# Patient Record
Sex: Male | Born: 1935 | Race: White | Hispanic: No | Marital: Married | Smoking: Never smoker
Health system: Southern US, Community
[De-identification: ages and names within clinical notes are randomized; demographics above are authoritative.]

## PROBLEM LIST (undated history)

## (undated) DIAGNOSIS — C61 Malignant neoplasm of prostate: Secondary | ICD-10-CM

## (undated) DIAGNOSIS — E119 Type 2 diabetes mellitus without complications: Secondary | ICD-10-CM

## (undated) HISTORY — PX: PROSTATECTOMY: SHX69

## (undated) HISTORY — DX: Malignant neoplasm of prostate: C61

## (undated) HISTORY — DX: Type 2 diabetes mellitus without complications: E11.9

---

## 2009-04-24 ENCOUNTER — Ambulatory Visit: Payer: Self-pay | Admitting: Sports Medicine

## 2009-04-24 DIAGNOSIS — M722 Plantar fascial fibromatosis: Secondary | ICD-10-CM

## 2009-05-14 ENCOUNTER — Ambulatory Visit: Payer: Self-pay | Admitting: Diagnostic Radiology

## 2009-05-14 ENCOUNTER — Emergency Department (HOSPITAL_BASED_OUTPATIENT_CLINIC_OR_DEPARTMENT_OTHER): Admission: EM | Admit: 2009-05-14 | Discharge: 2009-05-14 | Payer: Self-pay | Admitting: Emergency Medicine

## 2009-05-26 ENCOUNTER — Ambulatory Visit: Payer: Self-pay | Admitting: Sports Medicine

## 2009-05-26 DIAGNOSIS — S93409A Sprain of unspecified ligament of unspecified ankle, initial encounter: Secondary | ICD-10-CM | POA: Insufficient documentation

## 2009-06-25 ENCOUNTER — Ambulatory Visit: Payer: Self-pay | Admitting: Sports Medicine

## 2009-08-07 ENCOUNTER — Ambulatory Visit: Payer: Self-pay | Admitting: Sports Medicine

## 2010-07-28 NOTE — Assessment & Plan Note (Signed)
Summary: F/U ANKLE/ORTHOTICS,[facility]   Vital Signs:  Patient profile:   75 year old male BP sitting:   134 / 82  Vitals Entered By: Lillia Pauls CMA (August 07, 2009 10:43 AM)  History of Present Illness: 75 yo M here for f/u R ankle sprain and R PF  1. R ankle sprain Much improved Wore ASO for 2 months straight and continues to use when on golf course No longer having pain in ankle No swelling.  2. R PF Increasing issues past 2 weeks Saw orthopedic physician in [place] and had injection in R PF without much relief Has been doing eccentric exercises but pain still fairly debilitating Icing and his insoles have a little arch support already.  Physical Exam  General:  Well-developed,well-nourished,in no acute distress; alert,appropriate and cooperative throughout examination Msk:  R ankle: No gross deformity, swelling, bruising No TTP about entire ankle. FROM Strength 5/5 all motions. 1+ ant drawer, neg talar tilt.  Normal inspection with no visible or palpable fat pad atrophy and no visible swelling/erythema. Patient is tender at medial insertion of plantar fascia into calcaneus. Great toe motion: full Arch shape: mild pronation with normal arch shape Other foot breakdown: mild transverse arch breakdown   Impression & Recommendations:  Problem # 1:  PLANTAR FASCIITIS, RIGHT (ICD-728.71) Assessment Deteriorated  Compliant with conservative treatment (exercises, arch strap, icing, nsaids as needed, had cortisone injection) and no relief - will try orthotics.  Patient was fitted for a : standard, cushioned, semi-rigid orthotic. The orthotic was heated and afterward the patient stood on the orthotic blank positioned on the orthotic stand. The patient was positioned in subtalar neutral position and 10 degrees of ankle dorsiflexion in a weight bearing stance. After completion of molding, a stable base was applied to the orthotic blank. The blank was ground to a stable  position for weight bearing. Size: 11 striped fasttek Base: blue med density eva Posting: none Additional orthotic padding: none Total prep time 40 minutes.  Felt very comfortable in these - pain improved immediately with this support.  Orders: [employer], each unit [phone])  Problem # 2:  ANKLE SPRAIN, RIGHT (ICD-845.00) Assessment: Improved Continue with ASO on uneven surfaces, home exercises.

## 2010-10-07 ENCOUNTER — Encounter: Payer: Self-pay | Admitting: Sports Medicine

## 2010-10-07 ENCOUNTER — Ambulatory Visit (INDEPENDENT_AMBULATORY_CARE_PROVIDER_SITE_OTHER): Payer: Medicare Other | Admitting: Sports Medicine

## 2010-10-07 VITALS — BP 146/78 | HR 47 | Ht 69.0 in | Wt 165.0 lb

## 2010-10-07 DIAGNOSIS — M25519 Pain in unspecified shoulder: Secondary | ICD-10-CM

## 2010-10-07 DIAGNOSIS — M25511 Pain in right shoulder: Secondary | ICD-10-CM | POA: Insufficient documentation

## 2010-10-07 NOTE — Assessment & Plan Note (Signed)
The patient was given a series of exercises to strengthen his internal rotation and some other exercises to stabilize the entire rotator cuff. I do not see any signs of rotator cuff tear and suspect this is a mild tendinopathy involving the subscapularis tendon. With this in mind I will keep him on the rehabilitation exercises. He can use some when necessary Advil Aleve. He can use heat as needed. He may start some easy swelling of the golf club when this was not painful. If it is not responding in 3 weeks I would like him to return and I will scan the shoulder to see how much injury is present.

## 2010-10-07 NOTE — Progress Notes (Signed)
  Subjective:    Patient ID: Tom Kim, male    DOB: 07/26/1935, 75 y.o.   MRN: [id number]  HPI 3 weeks ago RT shoulder injury on bench press on nautilus.  RT arm went weak during actual lift. Noted some radiating pain to elbow but not below. Noted swelling at trap area that responded to ice.  He was seen in urgent care Center in [place] but they only suggested resting. He wants to rehabilitate this injury so he can return to golf. Currently is not having any night pain. He is now using some heat on the shoulder which has improved his motion. He still feels pain in contrast to reach behind his back.  Review of Systems     Objective:   Physical Exam Patient is in no acute distress  Shoulder: Inspection reveals no abnormalities,  atrophy or asymmetry. Palpation is normal with no tenderness over AC joint or bicipital groove. ROM is full in all planes with some limitation on back scratch Rotator cuff strength normal throughout except push off test and IR with the arm elevated and ER No signs of impingement with negative Neer and Hawkin's tests, empty can. Speeds and Yergason's tests normal. No labral pathology noted with negative Obrien's, negative clunk and good stability. Normal scapular function observed. No painful arc and no drop arm sign. No apprehension sign       Assessment & Plan:

## 2011-04-05 ENCOUNTER — Ambulatory Visit (INDEPENDENT_AMBULATORY_CARE_PROVIDER_SITE_OTHER): Payer: Medicare Other | Admitting: Sports Medicine

## 2011-04-05 VITALS — BP 140/80

## 2011-04-05 DIAGNOSIS — M25511 Pain in right shoulder: Secondary | ICD-10-CM

## 2011-04-05 DIAGNOSIS — M25519 Pain in unspecified shoulder: Secondary | ICD-10-CM

## 2011-04-05 MED ORDER — NITROGLYCERIN 0.2 MG/HR TD PT24: MEDICATED_PATCH | TRANSDERMAL | Status: AC

## 2011-04-05 NOTE — Progress Notes (Signed)
  Subjective:    Patient ID: Tom Kim, male    DOB: Dec 22, 1935, 75 y.o.   MRN: [id number]  HPI 75 y/o male is here for follow up for right shoulder pain.  At the last visit there was a concern for possible impingement syndrome.  He has been doing rotator cuff rehab and taking alleve.  His symptoms have not improved very much since the last visit.  He gets pain reaching overhead and at night.  The pain is lateral and radiates down the lateral part of the upper arm to the elbow.  Never below the elbow.   Review of Systems     Objective:   Physical Exam Shoulder:  Palpation is normal with no tenderness over AC joint or bicipital groove. ROM: FF to 170, abduction to 170, IR to T10, external rotation to 70 Rotator cuff strength normal slightly decreased from the unaffected side with the arm abducted and internally rotated Mildly positive empty can and Hawkins test Speeds and Yergason's tests normal. No labral pathology noted with negative Obrien's No painful arc and no drop arm sign. No apprehension sign but he does have pain with internal rotation against resistance  Ultrasound:  Biceps tendon has a small fluid pocket best visualized on the long view.  The subscapularis tendon has calcifications c/w an old injury or chronic inflammation.  There is no impingement of the subscapularis with dynamic testing.  The supraspinatus tendon appears normal but does show impingement on dynamic testing.      Assessment & Plan:

## 2011-04-05 NOTE — Assessment & Plan Note (Addendum)
Will continue with his rotator cuff rehab exercises but he will start doing them with a theraband.  Starting NTG today, 1/4 patch daily.  We will recheck this in 6 weeks  He can continue weight activities that do not create pain or stress his rotator cuff

## 2011-04-05 NOTE — Patient Instructions (Signed)
1. Start using your theraband with your exercises.  Avoid going above the head for 2 weeks.  2. Start your patches but let us know if you dizzy or headaches.  3. Follow in 6 weeks.

## 2011-05-17 ENCOUNTER — Ambulatory Visit (INDEPENDENT_AMBULATORY_CARE_PROVIDER_SITE_OTHER): Payer: Medicare Other | Admitting: Sports Medicine

## 2011-05-17 VITALS — BP 130/70

## 2011-05-17 DIAGNOSIS — M25511 Pain in right shoulder: Secondary | ICD-10-CM

## 2011-05-17 DIAGNOSIS — M25519 Pain in unspecified shoulder: Secondary | ICD-10-CM

## 2011-05-17 NOTE — Progress Notes (Signed)
  Subjective:    Patient ID: Alessandra Grout, male    DOB: June 03, 1936, 75 y.o.   MRN: [id number]  HPI  75 y/o male is here to follow up on right shoulder pain.  He is about the same as before.  He has discomfort with overhead lifting and with reaching across his chest.  He is doing his RTC exercises with the theraband without difficulty.   Review of Systems     Objective:   Physical Exam  Right shoulder: Inspection reveals no abnormalities, atrophy or asymmetry. Tenderness to palpation over the biceps tendon. ROM is full in FF and Abduction but decreased in internal rotation (to L5) Rotator cuff is somewhat decreased but not markedly  Equivocal empty can (pt indicates that it is a mild discomfort but not pain). Positive Obrien's  Negative speed's Equivocal painful arc (pt indicates that it is a mild discomfort but not pain) No drop arm sign.   Ultrasound: Fluid pocket visualized around the biceps tendon is smaller than on previous scan.  The subscapularis tendon has some calcification that would be c/w an old injury.  The supraspinatus and infraspinatus are normal.  There is no impingement on dynamic testing.      Assessment & Plan:

## 2011-05-17 NOTE — Patient Instructions (Signed)
1. Continue doing your theraband exercises but be careful not to push yourself.  You shouldn't experience pain with your exercise.  2. Start wearing 1/2 patch a day.  Wear the patch on the front part of the shoulder.  3. Follow up with [place] before you leave town.

## 2011-05-17 NOTE — Assessment & Plan Note (Signed)
His pain focus is now at the biceps tendon.  We will increase him to 1/2 patch daily, continue theraband exercises, and follow up in about a month.

## 2011-06-15 ENCOUNTER — Ambulatory Visit (INDEPENDENT_AMBULATORY_CARE_PROVIDER_SITE_OTHER): Payer: Medicare Other | Admitting: Sports Medicine

## 2011-06-15 VITALS — BP 110/70

## 2011-06-15 DIAGNOSIS — M25511 Pain in right shoulder: Secondary | ICD-10-CM

## 2011-06-15 DIAGNOSIS — M25519 Pain in unspecified shoulder: Secondary | ICD-10-CM

## 2011-06-15 NOTE — Patient Instructions (Signed)
It was great to see you today!  Great work on healing your shoulder tear.  Keep working on stretching and strengthening exercises.  Start with 3 lbs dumbbells and do stretching exercises as directed by Dr. Darrick Penna.  Use the nitro patch for another month and use it every other day after that to wean.

## 2011-06-15 NOTE — Assessment & Plan Note (Signed)
Tear healed. U/S better. Remaining scar tissue causing decreased range of motion and pain. Decrease nitro patch. Stretching exercises. F/u prn

## 2011-06-15 NOTE — Progress Notes (Signed)
  Subjective:    Patient ID: Tom Kim, male    DOB: [birth year], 75 y.o.   MRN: [id number]  HPI  75 y/o male is here to follow up on right shoulder pain. He has no change. Still has pain. Especially when he sleeps on right side. He is using a half of the nitro patch. He is not taking PO meds or topical nsaids.   While his pain is about the same he feels that he is stronger and is able to do all of the rehabilitation exercise pretty easily with theraband   Review of Systems No fevers, chills, weight loss.    Objective:   Physical Exam  Right shoulder: Inspection reveals no abnormalities, atrophy or asymmetry. No tenderness to palpation over the biceps tendon. No tenderness at any point on the shoulder. ROM is full in FF and Abduction, slower movement than left arm 2nd guarding. Negative empty can Negative speed's Pain and weakness with external rotation. Internal rotation normal. Hawkins negative.  Ultrasound RT shoulder Bicipital tendon looks improved from last visit and is normal today There is some distal calcifications in both base of the scapula there is an infraspinatus tendons The supraspinatous tendon is normal No rotator cuff tears are visualized Humeral head is slightly irregular AC. joint shows some arthritic change     Assessment & Plan:

## 2011-06-17 ENCOUNTER — Other Ambulatory Visit: Payer: Self-pay | Admitting: *Deleted

## 2011-06-17 DIAGNOSIS — M25511 Pain in right shoulder: Secondary | ICD-10-CM

## 2011-11-10 ENCOUNTER — Encounter: Payer: Self-pay | Admitting: Sports Medicine

## 2011-11-10 ENCOUNTER — Ambulatory Visit (INDEPENDENT_AMBULATORY_CARE_PROVIDER_SITE_OTHER): Payer: Medicare Other | Admitting: Sports Medicine

## 2011-11-10 VITALS — BP 116/72 | HR 59

## 2011-11-10 DIAGNOSIS — M25511 Pain in right shoulder: Secondary | ICD-10-CM

## 2011-11-10 DIAGNOSIS — M25519 Pain in unspecified shoulder: Secondary | ICD-10-CM

## 2011-11-10 NOTE — Progress Notes (Signed)
  Subjective:    Patient ID: Alessandra Grout, male    DOB: 07/08/1935, 76 y.o.   MRN: [id number]  HPI  Pt presents to clinic for f/u of rt shoulder pain which he says is 75% improved. Did PT in [place] over the winter which was helpful. ROM is improving.  Now doing home exercises and continues to use 1/2 NTG patch on shoulder daily. Has some discomfort when he lays on rt shoulder when in bed. Recently started yoga, has some pain with certain poses such as downward dog.  Aleve is helpful for pain.  No night pain   Review of Systems     Objective:   Physical Exam  NAD  Good biceps function bilat, biceps groove non tender Speed's and yergason's negative bilat ER and IR at waist normal Empty can negative on rt Hawkins negative on rt Cross over test normal on rt Back scratch equal bilat Good strength with ER at 90 deg abduction        Assessment & Plan:

## 2011-11-10 NOTE — Patient Instructions (Signed)
You are 90% improved according to your exam.  It is ok for you to discontinue nitroglycerin patch  Continue doing range of motion exercises daily  It is ok for you to resume preferred upper body strengthening activities  Please follow up as needed  Thank you for seeing [place] today!

## 2011-11-11 NOTE — Assessment & Plan Note (Signed)
He has minimal RC signs on exam Last [place] had essentailly normalized  I think he is able to do 90% of what he wants  Keep HEP   Use prn meds  Reck with me prn  OK to stop NTG

## 2012-11-01 ENCOUNTER — Ambulatory Visit (INDEPENDENT_AMBULATORY_CARE_PROVIDER_SITE_OTHER): Payer: Medicare Other | Admitting: Sports Medicine

## 2012-11-01 ENCOUNTER — Encounter: Payer: Self-pay | Admitting: Sports Medicine

## 2012-11-01 VITALS — BP 110/70 | Ht 70.0 in | Wt 164.0 lb

## 2012-11-01 DIAGNOSIS — M25511 Pain in right shoulder: Secondary | ICD-10-CM

## 2012-11-01 DIAGNOSIS — M25519 Pain in unspecified shoulder: Secondary | ICD-10-CM

## 2012-11-01 NOTE — Progress Notes (Signed)
Chief complaint: Right shoulder pain  History of present illness: The patient has had some right shoulder pain for quite some time and was seen one year ago for similar complaint. The patient did do physical therapy in [place] again over the course of time and is continued home exercises improving his range of motion. 18 months ago patient did have a ultrasound done that showed improvement of a subscapularis tendon calcification with potential tear as well as biceps tendinitis.  Patient is able to do all activities of daily living it states that when he tries to do pushups or if he tries to do more than 18 holes of golf a day he starts having pain in his right shoulder. Patient also states from time to time it does wake him up when he lays on top of the shoulder. Patient denies any radiation down the arm, denies any numbness or tingling. Patient has taken Aleve for it previously and it does help but he does not like to take medications when possible.  Past medical history, social, surgical and family history all reviewed.   Physical exam Blood pressure 110/70, height 5\' 10"  (1.778 m), weight 164 lb (74.39 kg). General: No apparent distress alert and oriented x3 mood and affect normal Respiratory: Patient's speak in full sentences and does not appear short of breath Skin: Warm dry intact with no signs of infection or rash Neuro: Cranial nerves II through XII are intact, neurovascularly intact in all extremities with 2+ DTRs and 2+ pulses. Right shoulder exam: On inspection there is no gross deformity. Patient is minimally tender to palpation over the insertion of the long head of the biceps. Patient does have a positive speed and positive Yergason's. Patient does have near full range of motion in all planes except for external rotation where he is limited to 35. Patient does have positive primary impingement signs and negative empty can.   Musculoskeletal ultrasound was performed and interpreted by me  today. Patient's ultrasound does show that patient has mild hypoechoic changes surrounding the proximal aspect of the bicep tendon. With range of motion patient does have subluxation medially out of the groove secondary to a tear in the retinaculum. This is likely causing his pain. Patient does have some mild calcific changes at the insertion of the subscapularis as well as the supraspinatus. There is no true tear appreciated today. The patient also has moderate to severe a.c. joint arthropathy noting spurring and hypoechoic changes.

## 2012-11-01 NOTE — Assessment & Plan Note (Signed)
The patient was given a compression sleeve today to help him with the subluxation of the bicep tendon. Patient was given home exercises that he will do 3-4 times a week. Encourage him to avoid activities such as bench press, pushups and pull-ups that could contribute to the subluxation. In addition this patient is likely having more pain secondary to a.c. arthropathy as well. If he continues to have difficulty with sleeping he will return and at that time we could consider doing an a.c. joint injection which could be beneficial.

## 2013-01-08 ENCOUNTER — Ambulatory Visit: Payer: Medicare Other | Admitting: Family Medicine

## 2013-01-09 ENCOUNTER — Ambulatory Visit: Payer: Medicare Other | Admitting: Family Medicine

## 2013-01-09 ENCOUNTER — Ambulatory Visit (INDEPENDENT_AMBULATORY_CARE_PROVIDER_SITE_OTHER): Payer: Medicare Other | Admitting: Family Medicine

## 2013-01-09 ENCOUNTER — Ambulatory Visit (HOSPITAL_BASED_OUTPATIENT_CLINIC_OR_DEPARTMENT_OTHER)
Admission: RE | Admit: 2013-01-09 | Discharge: 2013-01-09 | Disposition: A | Discharge status: Home / Self Care | Payer: Medicare Other | Source: Ambulatory Visit | Attending: Family Medicine | Admitting: Family Medicine

## 2013-01-09 ENCOUNTER — Encounter: Payer: Self-pay | Admitting: Family Medicine

## 2013-01-09 VITALS — BP 150/80 | HR 52 | Ht 70.0 in | Wt 164.0 lb

## 2013-01-09 DIAGNOSIS — M79609 Pain in unspecified limb: Secondary | ICD-10-CM

## 2013-01-09 DIAGNOSIS — M79605 Pain in left leg: Secondary | ICD-10-CM

## 2013-01-09 DIAGNOSIS — M25552 Pain in left hip: Secondary | ICD-10-CM | POA: Insufficient documentation

## 2013-01-09 DIAGNOSIS — M25559 Pain in unspecified hip: Secondary | ICD-10-CM

## 2013-01-09 DIAGNOSIS — M899 Disorder of bone, unspecified: Secondary | ICD-10-CM | POA: Insufficient documentation

## 2013-01-09 DIAGNOSIS — M5137 Other intervertebral disc degeneration, lumbosacral region: Secondary | ICD-10-CM | POA: Insufficient documentation

## 2013-01-09 DIAGNOSIS — M51379 Other intervertebral disc degeneration, lumbosacral region without mention of lumbar back pain or lower extremity pain: Secondary | ICD-10-CM | POA: Insufficient documentation

## 2013-01-09 NOTE — Assessment & Plan Note (Signed)
radiographs of hip and back only consistent with DDD, facet arthritis.  No hip pathology.  Description of pain and otherwise normal exam consistent with either an irritated nerve (radiculopathy) or early IT band syndrome.  Discussed options.  He would like to start with home exercise program and regular aleve.  Discussed PT, prednisone - declined for now.  Would have to be cautious with prednisone given his IDDM.  No evidence of bursitis to warrant injection.  Activities as tolerated.  F/u in 5-6 weeks for reevaluation.

## 2013-01-09 NOTE — Patient Instructions (Addendum)
You have symptoms and an exam indicative of an irritated nerve in your back and/or IT band syndrome. We will treat for both conditions. Take aleve 2 tabs twice a day with food. Start home exercises (including the side leg raises) once a day - hold stretches for 20-30 seconds and repeat 3 times;  For strengthening do 3 sets of 10 once a day. Consider prednisone dose pack. Consider injection into the bursa on the outside of the hip but it's really not tender enough here to try this for now. If not improving go ahead with physical therapy. Activities as tolerated - it's ok to play golf, exercise as long as you're not limping and pain is only a 1-2 on a scale of 1-10. Follow up with me or Dr. Darrick Penna in about 5-6 weeks for reevaluation.

## 2013-01-09 NOTE — Progress Notes (Signed)
Patient ID: Tom Kim, male   DOB: June 17, 1936, 77 y.o.   MRN: [id number]  PCP: Pcp Not In System  Subjective:   HPI: Patient is a 77 y.o. male here for left leg pain.  Patient reports over past 2-3 weeks he's had worsening left leg/hip pain. No known injury though he does yoga and plays golf most days of the week. Pain has come on suddenly and sharply a couple times when walking. Has tried aleve. No numbness or tingling. Radiates lateral hip down thigh. No back pain. No night pain. No prior issues with hip or back.  Past Medical History  Diagnosis Date  . Diabetes mellitus without complication   . Prostate cancer     Current Outpatient Prescriptions on File Prior to Visit  Medication Sig Dispense Refill  . aspirin 325 MG tablet Take 325 mg by mouth daily.      . Cholecalciferol (VITAMIN D3) 1000 UNITS CAPS Take 2,000 Units by mouth daily.      . Insulin Aspart (NOVOLOG FLEXPEN [place]) Inject 0-4 Units into the skin 3 (three) times daily before meals.      . insulin glargine (LANTUS) 100 UNIT/ML injection Inject 9 Units into the skin at bedtime.      . Biotin 5 MG CAPS Take 5 mg by mouth daily.      . nitroGLYCERIN (NITRODUR - DOSED IN MG/24 HR) 0.2 mg/hr Apply 1/4 of a patch of the right shoulder in the area of pain, wear for 24 hours, change daily  30 patch  11   No current facility-administered medications on file prior to visit.    Past Surgical History  Procedure Laterality Date  . Prostatectomy      Allergies  Allergen Reactions  . Lisinopril     Severe dizziness  . Imipramine     dizziness    History   Social History  . Marital Status: Married    Spouse Name: N/A    Number of Children: N/A  . Years of Education: N/A   Occupational History  . Not on file.   Social History Main Topics  . Smoking status: Never Smoker   . Smokeless tobacco: Not on file  . Alcohol Use: Not on file  . Drug Use: Not on file  . Sexually Active: Not on file   Other Topics  Concern  . Not on file   Social History Narrative  . No narrative on file    Family History  Problem Relation Age of Onset  . Diabetes Mother   . Hyperlipidemia Mother   . Hypertension Mother   . Diabetes Brother   . Heart attack Neg Hx   . Sudden death Neg Hx     BP 150/80  Pulse 52  Ht 5\' 10"  (1.778 m)  Wt 164 lb (74.39 kg)  BMI 23.53 kg/m2  Review of Systems: See HPI above.    Objective:  Physical Exam:  Gen: NAD  Back: No gross deformity, scoliosis. No TTP paraspinal regions, left trochanter, piriformis, elsewhere buttock, leg, hip.  No midline or bony TTP. FROM hip and back without pain. Strength LEs 5/5 all muscle groups.   2+ MSRs in patellar and achilles tendons, equal bilaterally. Negative SLRs. Sensation intact to light touch bilaterally. Negative logroll bilateral hips Negative fabers and piriformis stretches though decreased motion bilateral fabers. Leg lengths equal    Assessment & Plan:  1. Left hip pain - radiographs of hip and back only consistent with DDD, facet arthritis.  No hip pathology.  Description of pain and otherwise normal exam consistent with either an irritated nerve (radiculopathy) or early IT band syndrome.  Discussed options.  He would like to start with home exercise program and regular aleve.  Discussed PT, prednisone - declined for now.  Would have to be cautious with prednisone given his IDDM.  No evidence of bursitis to warrant injection.  Activities as tolerated.  F/u in 5-6 weeks for reevaluation.

## 2013-02-15 ENCOUNTER — Encounter: Payer: Self-pay | Admitting: Family Medicine

## 2013-02-15 ENCOUNTER — Ambulatory Visit (INDEPENDENT_AMBULATORY_CARE_PROVIDER_SITE_OTHER): Payer: Medicare Other | Admitting: Family Medicine

## 2013-02-15 VITALS — BP 111/71 | HR 67 | Ht 70.0 in | Wt 163.0 lb

## 2013-02-15 DIAGNOSIS — M25552 Pain in left hip: Secondary | ICD-10-CM

## 2013-02-15 DIAGNOSIS — M25559 Pain in unspecified hip: Secondary | ICD-10-CM

## 2013-02-16 ENCOUNTER — Encounter: Payer: Self-pay | Admitting: Family Medicine

## 2013-02-19 ENCOUNTER — Encounter: Payer: Self-pay | Admitting: Family Medicine

## 2013-02-19 NOTE — Progress Notes (Signed)
Patient ID: Tom Kim, male   DOB: [birth year], 77 y.o.   MRN: [id number]  PCP: Pcp Not In System  Subjective:   HPI: Patient is a 77 y.o. male here for f/u left leg pain.  7/15: Patient reports over past 2-3 weeks he's had worsening left leg/hip pain. No known injury though he does yoga and plays golf most days of the week. Pain has come on suddenly and sharply a couple times when walking. Has tried aleve. No numbness or tingling. Radiates lateral hip down thigh. No back pain. No night pain. No prior issues with hip or back.  8/21: Patient reports he has improved since last visit. Gets a jab feeling at times that is fleeting in posterior left buttock area. Doing HEP regularly. Takes occasional aleve. Very pleased with his progress to this point.  Past Medical History  Diagnosis Date  . Diabetes mellitus without complication   . Prostate cancer     Current Outpatient Prescriptions on File Prior to Visit  Medication Sig Dispense Refill  . aspirin 325 MG tablet Take 325 mg by mouth daily.      . Biotin 5 MG CAPS Take 5 mg by mouth daily.      . Cholecalciferol (VITAMIN D3) 1000 UNITS CAPS Take 2,000 Units by mouth daily.      . Insulin Aspart (NOVOLOG FLEXPEN [place]) Inject 0-4 Units into the skin 3 (three) times daily before meals.      . insulin glargine (LANTUS) 100 UNIT/ML injection Inject 9 Units into the skin at bedtime.      . nitroGLYCERIN (NITRODUR - DOSED IN MG/24 HR) 0.2 mg/hr Apply 1/4 of a patch of the right shoulder in the area of pain, wear for 24 hours, change daily  30 patch  11   No current facility-administered medications on file prior to visit.    Past Surgical History  Procedure Laterality Date  . Prostatectomy      Allergies  Allergen Reactions  . Lisinopril     Severe dizziness  . Imipramine     dizziness    History   Social History  . Marital Status: Married    Spouse Name: N/A    Number of Children: N/A  . Years of Education: N/A    Occupational History  . Not on file.   Social History Main Topics  . Smoking status: Never Smoker   . Smokeless tobacco: Not on file  . Alcohol Use: Not on file  . Drug Use: Not on file  . Sexual Activity: Not on file   Other Topics Concern  . Not on file   Social History Narrative  . No narrative on file    Family History  Problem Relation Age of Onset  . Diabetes Mother   . Hyperlipidemia Mother   . Hypertension Mother   . Diabetes Brother   . Heart attack Neg Hx   . Sudden death Neg Hx     BP 111/71  Pulse 67  Ht 5\' 10"  (1.778 m)  Wt 163 lb (73.936 kg)  BMI 23.39 kg/m2  Review of Systems: See HPI above.    Objective:  Physical Exam:  Gen: NAD  Back: No gross deformity, scoliosis. No TTP paraspinal regions, left trochanter, piriformis, elsewhere buttock, leg, hip.  No midline or bony TTP. FROM hip and back without pain. Strength LEs 5/5 all muscle groups.   2+ MSRs in patellar and achilles tendons, equal bilaterally. Negative SLRs. Sensation intact to light touch bilaterally.  Negative logroll bilateral hips Negative fabers and piriformis stretches though decreased motion bilateral fabers.    Assessment & Plan:  1. Left hip pain - radiographs of hip and back only consistent with DDD, facet arthritis.  Pain much improved subjectively and exam benign.  He would like to continue with HEP and aleve as needed.  Consider formal PT, prednisone if symptoms recur or worsen.  F/u prn.

## 2013-02-19 NOTE — Assessment & Plan Note (Signed)
radiographs of hip and back only consistent with DDD, facet arthritis.  Pain much improved subjectively and exam benign.  He would like to continue with HEP and aleve as needed.  Consider formal PT, prednisone if symptoms recur or worsen.  F/u prn.

## 2013-02-27 ENCOUNTER — Encounter: Payer: Self-pay | Admitting: Family Medicine

## 2013-02-27 ENCOUNTER — Ambulatory Visit (INDEPENDENT_AMBULATORY_CARE_PROVIDER_SITE_OTHER): Payer: Medicare Other | Admitting: Family Medicine

## 2013-02-27 VITALS — BP 131/80 | HR 59 | Ht 70.0 in | Wt 165.0 lb

## 2013-02-27 DIAGNOSIS — M545 Low back pain: Secondary | ICD-10-CM

## 2013-02-27 NOTE — Patient Instructions (Addendum)
You pulled your latissimus dorsi muscle on this side. Do home exercises - pick 3-5 (make sure you include something with side bending) and follow instructions - do daily for next 4 weeks. Aleve 1-2 tabs twice a day with food as needed for pain. If pain is worse than a 3 on a scale of 1-10, stop playing. You may have to back down to playing 9 holes if you have difficulty still when playing on Friday. Follow up with me as needed.

## 2013-03-01 ENCOUNTER — Encounter: Payer: Self-pay | Admitting: Family Medicine

## 2013-03-01 DIAGNOSIS — M545 Low back pain: Secondary | ICD-10-CM | POA: Insufficient documentation

## 2013-03-01 NOTE — Progress Notes (Signed)
Patient ID: Tom Kim, male   DOB: 1935-10-18, 77 y.o.   MRN: [id number]  PCP: Pcp Not In System  Subjective:   HPI: Patient is a 77 y.o. male here for right side pain.  Patient reports he played golf at end of last week. After about 9 holes he felt like with bending to right side he had more pain in right side, had to ease off. Took aleve which helped. Pain intensified Saturday, up to 9/10. Has decreased to now a 2/10, much better. No swelling or bruising. Used heating pad, muscle rub.  Past Medical History  Diagnosis Date  . Diabetes mellitus without complication   . Prostate cancer     Current Outpatient Prescriptions on File Prior to Visit  Medication Sig Dispense Refill  . aspirin 325 MG tablet Take 325 mg by mouth daily.      . Biotin 5 MG CAPS Take 5 mg by mouth daily.      . Cholecalciferol (VITAMIN D3) 1000 UNITS CAPS Take 2,000 Units by mouth daily.      . Insulin Aspart (NOVOLOG FLEXPEN [place]) Inject 0-4 Units into the skin 3 (three) times daily before meals.      . insulin glargine (LANTUS) 100 UNIT/ML injection Inject 9 Units into the skin at bedtime.      . nitroGLYCERIN (NITRODUR - DOSED IN MG/24 HR) 0.2 mg/hr Apply 1/4 of a patch of the right shoulder in the area of pain, wear for 24 hours, change daily  30 patch  11   No current facility-administered medications on file prior to visit.    Past Surgical History  Procedure Laterality Date  . Prostatectomy      Allergies  Allergen Reactions  . Lisinopril     Severe dizziness  . Imipramine     dizziness    History   Social History  . Marital Status: Married    Spouse Name: N/A    Number of Children: N/A  . Years of Education: N/A   Occupational History  . Not on file.   Social History Main Topics  . Smoking status: Never Smoker   . Smokeless tobacco: Not on file  . Alcohol Use: Not on file  . Drug Use: Not on file  . Sexual Activity: Not on file   Other Topics Concern  . Not on file    Social History Narrative  . No narrative on file    Family History  Problem Relation Age of Onset  . Diabetes Mother   . Hyperlipidemia Mother   . Hypertension Mother   . Diabetes Brother   . Heart attack Neg Hx   . Sudden death Neg Hx     BP 131/80  Pulse 59  Ht 5\' 10"  (1.778 m)  Wt 165 lb (74.844 kg)  BMI 23.68 kg/m2  Review of Systems: See HPI above.    Objective:  Physical Exam:  Gen: NAD  Back: No gross deformity, scoliosis. No paraspinal TTP .  No midline or bony TTP. FROM with pain on right side bend only. Strength LEs 5/5 all muscle groups.   2+ MSRs in patellar and achilles tendons, equal bilaterally. Negative SLRs. Sensation intact to light touch bilaterally. Negative logroll bilateral hips    Assessment & Plan:  1. Right low back pain - consistent with lumbar or lat dorsi strain.  Improving.  Aleve, home exercises (demonstrated today), heating pad.  Discussed relative rest from golf.  F/u prn.

## 2013-03-01 NOTE — Assessment & Plan Note (Signed)
Right low back pain - consistent with lumbar or lat dorsi strain.  Improving.  Aleve, home exercises (demonstrated today), heating pad.  Discussed relative rest from golf.  F/u prn.

## 2014-04-11 ENCOUNTER — Telehealth: Payer: Self-pay | Admitting: Family Medicine

## 2014-04-11 NOTE — Telephone Encounter (Signed)
We received their request but because it's been more than 1 year since we've seen him he would need an appointment and evaluation before we could write a new order.  He could see a family doctor or orthopedist closer to him for this as well and get the referral assuming he's dealing with the same issues.

## 2014-09-08 IMAGING — CR DG LUMBAR SPINE 2-3V
3 series · 3 of 3 positions shown · non-contrast
Comparison: None.

CLINICAL DATA: Hip pain for 2-3 weeks

LUMBAR SPINE - 2-3 VIEW

[t l-spine a.p.]
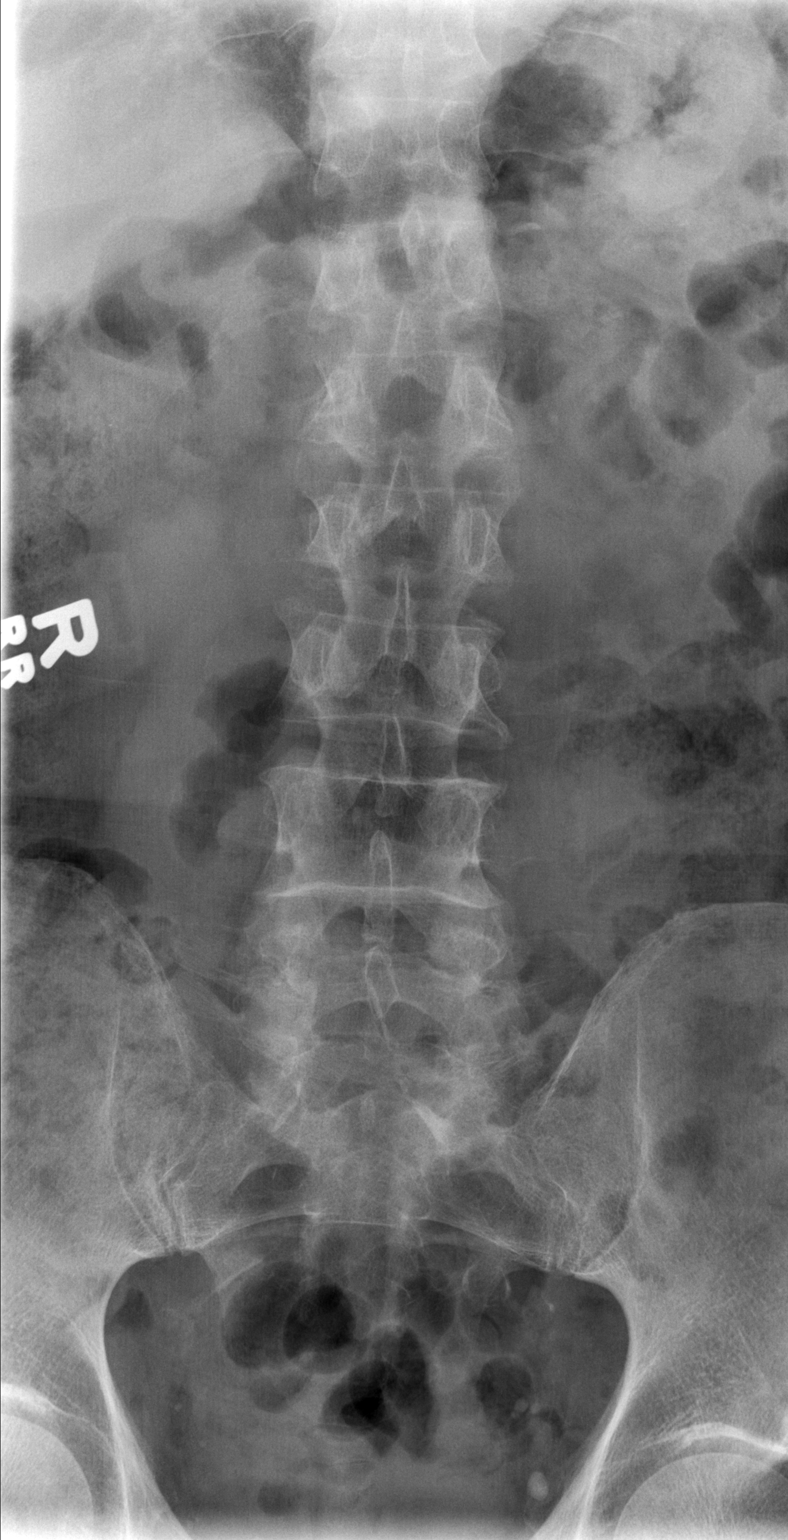

[t l-spine lat]
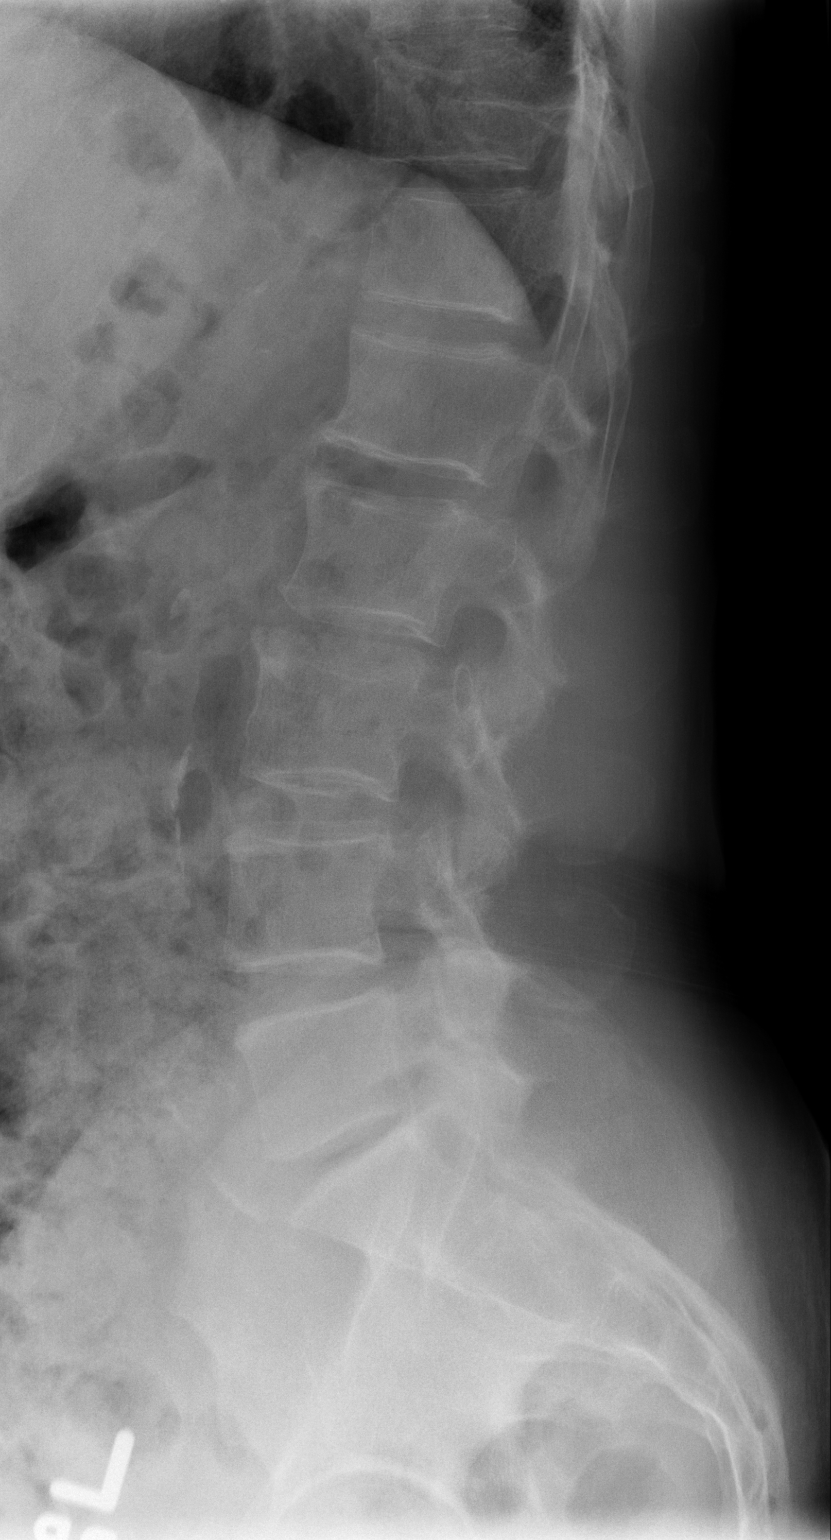

[t l-spine l5-s1 spot]
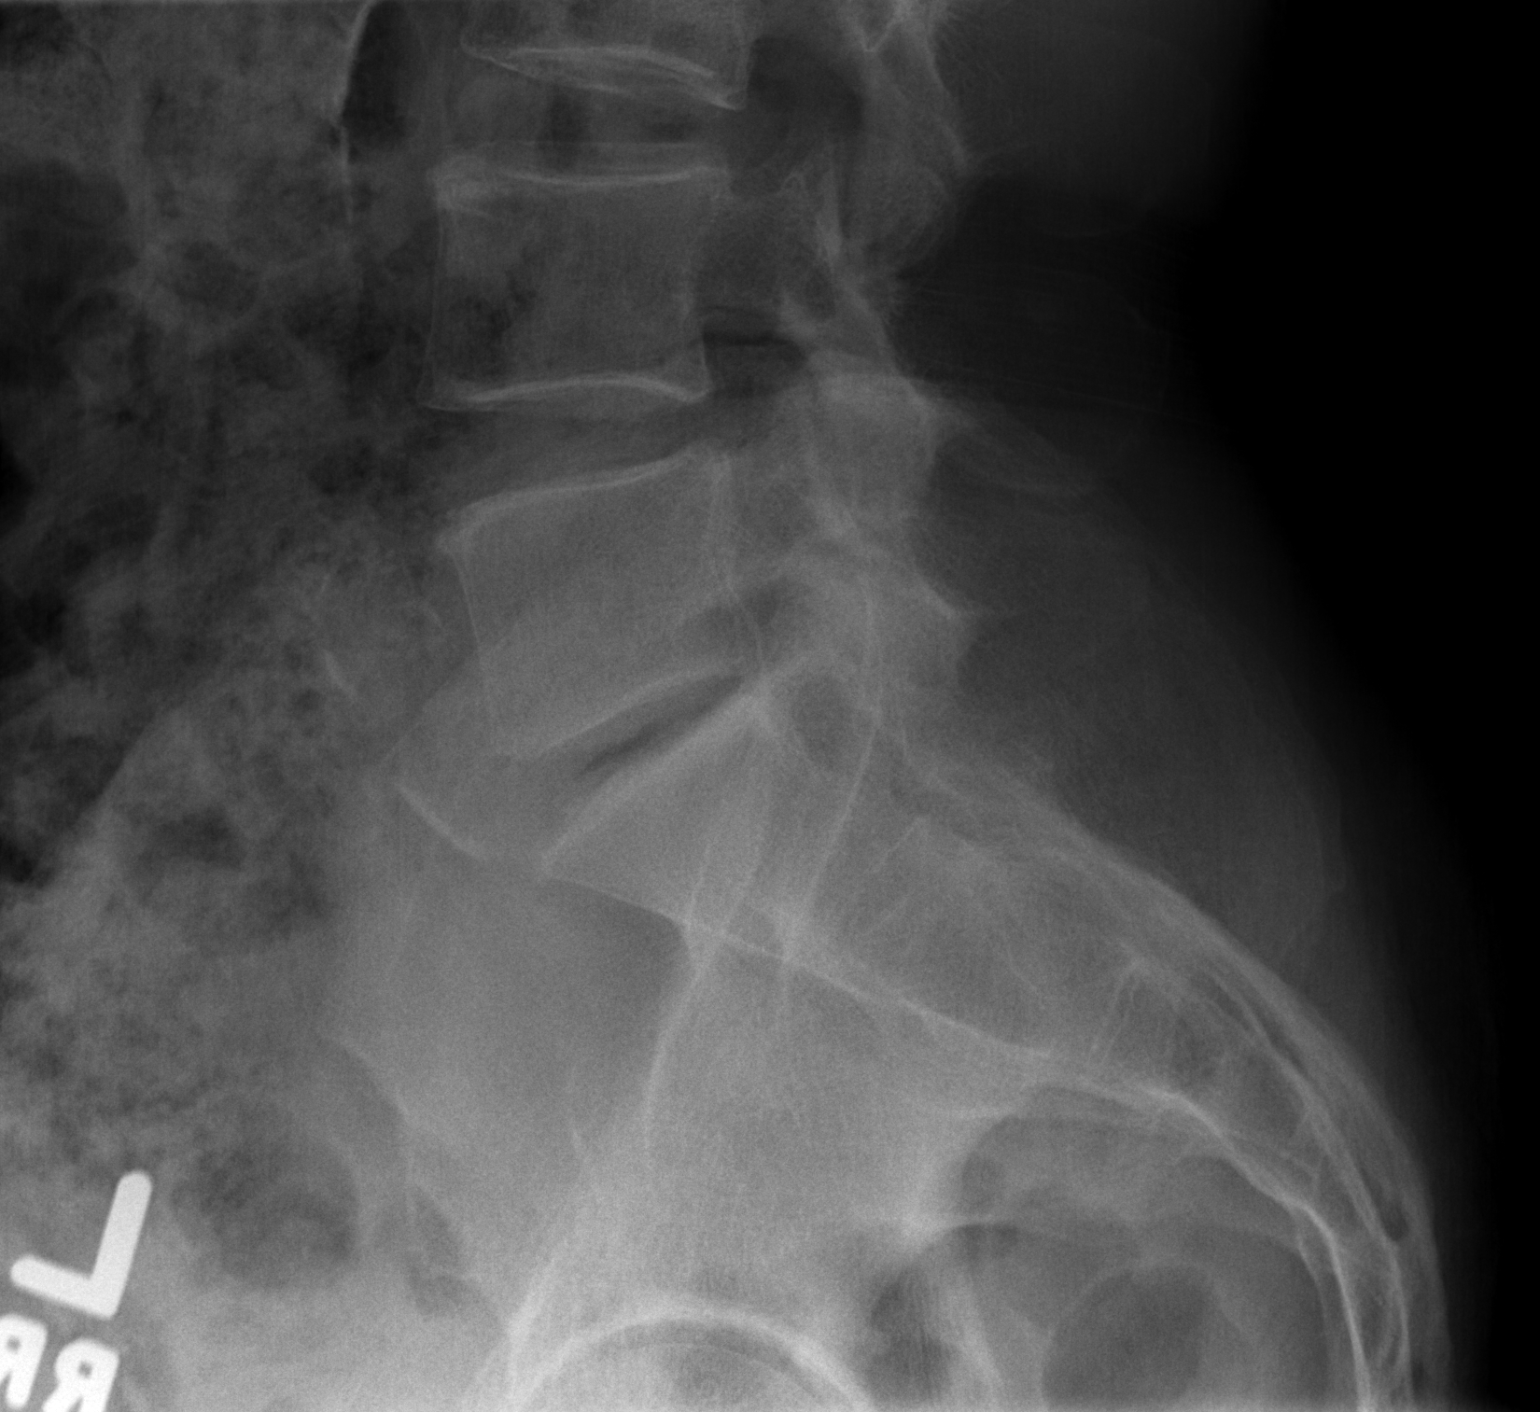

[3 of 3 positions shown; findings below may reference images not displayed]

FINDINGS: Mild disc space narrowing L2-3 and L5-S1.  Slight
retrolisthesis at L2-3 measuring 3 mm.     No compression fracture.
Moderate stool burden.  Osteopenia.  Slight superior endplate
depression T11, age indeterminate.
IMPRESSION: Lumbar degenerative changes as described.

## 2016-04-21 ENCOUNTER — Ambulatory Visit (INDEPENDENT_AMBULATORY_CARE_PROVIDER_SITE_OTHER): Payer: Medicare Other | Admitting: Sports Medicine

## 2016-04-21 ENCOUNTER — Encounter: Payer: Self-pay | Admitting: Sports Medicine

## 2016-04-21 DIAGNOSIS — M25561 Pain in right knee: Secondary | ICD-10-CM | POA: Diagnosis present

## 2016-04-21 NOTE — Progress Notes (Signed)
  Tom Kim - 80 y.o. male MRN [id number]  Date of birth: Jan 01, 1936  SUBJECTIVE:  Including CC & ROS.  Chief Complaint  Patient presents with  . Knee Pain    Tom Kim is a 80 yo M that is presenting with right knee pain. He reports he is doing well today. He was performing a maneuver in yoga and felt a twist of his knee. He was rotating medially when this occurred. This was two months ago. He has received a steroid injection in his knee and has had noticed improvement. He reports some medial knee pain. He feels unstable with walking.   ROS: No unexpected weight loss, fever, chills, instability, muscle pain, numbness/tingling, redness, otherwise see HPI    HISTORY: Past Medical, Surgical, Social, and Family History Reviewed & Updated per EMR.   Pertinent Historical Findings include: PMSHx -  Insulin dependent diabetes, skin cancer, prostate cancer  PSHx -  No tobacco or alcohol use  FHx -  DM Medications - insulin, ASA   DATA REVIEWED: None to review   PHYSICAL EXAM:  VS: BP:(!) 160/96  HR:(!) 52bpm  TEMP: ( )  RESP:   HT:6' (182.9 cm)   WT:158 lb (71.7 kg)  BMI:21.5 PHYSICAL EXAM: Gen: NAD, alert, cooperative with exam, well-appearing HEENT: clear conjunctiva, EOMI CV:  no edema, capillary refill brisk,  Resp: non-labored, normal speech Skin: no rashes, normal turgor  Neuro: no gross deficits.  Psych:  alert and oriented Right knee:  No obvious swelling.  Some TTP of the medial joint line.  No TTP of the latera joint line.  Normal quad and patellar tendon.  Normal strength  Normal flexion and extension Normal McMurray's  No pain with patellar grind or compression Hip abduction strength is equal on left and right. No overt weakness    ASSESSMENT & PLAN:   Right knee pain Pain is most likely related to a degenerative mensicus and/or OA. He is doing well status post two injections.  - given reassurance that he should be able to run and perform yoga.  - body  helix of his knee.  - encouraged quadriceps strengthening exercises  - f/u PRN

## 2016-04-21 NOTE — Patient Instructions (Signed)
Thank you for coming in,   Perform single leg squats at reps of 10-15   Try single leg wall slides into a sit at 90 degress   Also try placing an object in front of you and perform alternating touches with your hand while on a single leg.     Please feel free to call with any questions or concerns at any time, at [phone]. --Dr. Raeford Razor

## 2016-04-25 DIAGNOSIS — M25561 Pain in right knee: Secondary | ICD-10-CM | POA: Insufficient documentation

## 2016-04-25 NOTE — Assessment & Plan Note (Signed)
Pain is most likely related to a degenerative mensicus and/or OA. He is doing well status post two injections.  - given reassurance that he should be able to run and perform yoga.  - body helix of his knee.  - encouraged quadriceps strengthening exercises  - f/u PRN
